# Patient Record
Sex: Male | Born: 1985 | Race: White | Hispanic: No | Marital: Married | Smoking: Never smoker
Health system: Southern US, Community
[De-identification: ages and names within clinical notes are randomized; demographics above are authoritative.]

## PROBLEM LIST (undated history)

## (undated) HISTORY — PX: CLOSED REDUCTION ELBOW FRACTURE: SHX930

## (undated) HISTORY — PX: HERNIA REPAIR: SHX51

## (undated) HISTORY — PX: OTHER SURGICAL HISTORY: SHX169

---
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
## 2014-12-04 ENCOUNTER — Ambulatory Visit (INDEPENDENT_AMBULATORY_CARE_PROVIDER_SITE_OTHER): Payer: BLUE CROSS/BLUE SHIELD | Admitting: Family Medicine

## 2014-12-04 VITALS — BP 118/72 | HR 65 | Temp 98.2°F | Resp 18 | Ht 71.0 in | Wt 139.0 lb

## 2014-12-04 DIAGNOSIS — S90562A Insect bite (nonvenomous), left ankle, initial encounter: Secondary | ICD-10-CM

## 2014-12-04 DIAGNOSIS — W57XXXA Bitten or stung by nonvenomous insect and other nonvenomous arthropods, initial encounter: Secondary | ICD-10-CM | POA: Diagnosis not present

## 2014-12-04 MED ORDER — HYDROXYZINE HCL 25 MG PO TABS: 25.0000 mg | ORAL_TABLET | ORAL | Status: DC | PRN

## 2014-12-04 MED ORDER — PERMETHRIN 5 % EX CREA: 1.0000 "application " | TOPICAL_CREAM | Freq: Once | CUTANEOUS | Status: DC

## 2014-12-04 NOTE — Patient Instructions (Signed)
Apply ice to itchy areas.  Try keeping the calamine topical benadryl,in the fridge o Insect Bite Mosquitoes, flies, fleas, bedbugs, and many other insects can bite. Insect bites are different from insect stings. A sting is when venom is injected into the skin. Some insect bites can transmit infectious diseases. SYMPTOMS  Insect bites usually turn red, swell, and itch for 2 to 4 days. They often go away on their own. TREATMENT  Your caregiver may prescribe antibiotic medicines if a bacterial infection develops in the bite. HOME CARE INSTRUCTIONS  Do not scratch the bite area.  Keep the bite area clean and dry. Wash the bite area thoroughly with soap and water.  Put ice or cool compresses on the bite area.  Put ice in a plastic bag.  Place a towel between your skin and the bag.  Leave the ice on for 20 minutes, 4 times a day for the first 2 to 3 days, or as directed.  You may apply a baking soda paste, cortisone cream, or calamine lotion to the bite area as directed by your caregiver. This can help reduce itching and swelling.  Only take over-the-counter or prescription medicines as directed by your caregiver.  If you are given antibiotics, take them as directed. Finish them even if you start to feel better. You may need a tetanus shot if:  You cannot remember when you had your last tetanus shot.  You have never had a tetanus shot.  The injury broke your skin. If you get a tetanus shot, your arm may swell, get red, and feel warm to the touch. This is common and not a problem. If you need a tetanus shot and you choose not to have one, there is a rare chance of getting tetanus. Sickness from tetanus can be serious. SEEK IMMEDIATE MEDICAL CARE IF:   You have increased pain, redness, or swelling in the bite area.  You see a red line on the skin coming from the bite.  You have a fever.  You have joint pain.  You have a headache or neck pain.  You have unusual weakness.  You  have a rash.  You have chest pain or shortness of breath.  You have abdominal pain, nausea, or vomiting.  You feel unusually tired or sleepy. MAKE SURE YOU:   Understand these instructions.  Will watch your condition.  Will get help right away if you are not doing well or get worse. Document Released: 04/22/2004 Document Revised: 06/07/2011 Document Reviewed: 10/14/2010 [facility] Patient Information 2015 [place], [place]. This information is not intended to replace advice given to you by your health care provider. Make sure you discuss any questions you have with your health care provider.  Scabies Scabies are small bugs (mites) that burrow under the skin and cause red bumps and severe itching. These bugs can only be seen with a microscope. Scabies are highly contagious. They can spread easily from person to person by direct contact. They are also spread through sharing clothing or linens that have the scabies mites living in them. It is not unusual for an entire family to become infected through shared towels, clothing, or bedding.  HOME CARE INSTRUCTIONS   Your caregiver may prescribe a cream or lotion to kill the mites. If cream is prescribed, massage the cream into the entire body from the neck to the bottom of both feet. Also massage the cream into the scalp and face if your child is less than 32 year old. Avoid the  eyes and mouth. Do not wash your hands after application.  Leave the cream on for 8 to 12 hours. Your child should bathe or shower after the 8 to 12 hour application period. Sometimes it is helpful to apply the cream to your child right before bedtime.  One treatment is usually effective and will eliminate approximately 95% of infestations. For severe cases, your caregiver may decide to repeat the treatment in 1 week. Everyone in your household should be treated with one application of the cream.  New rashes or burrows should not appear within 24 to 48 hours after successful  treatment. However, the itching and rash may last for 2 to 4 weeks after successful treatment. Your caregiver may prescribe a medicine to help with the itching or to help the rash go away more quickly.  Scabies can live on clothing or linens for up to 3 days. All of your child's recently used clothing, towels, stuffed toys, and bed linens should be washed in hot water and then dried in a dryer for at least 20 minutes on high heat. Items that cannot be washed should be enclosed in a plastic bag for at least 3 days.  To help relieve itching, bathe your child in a cool bath or apply cool washcloths to the affected areas.  Your child may return to school after treatment with the prescribed cream. SEEK MEDICAL CARE IF:   The itching persists longer than 4 weeks after treatment.  The rash spreads or becomes infected. Signs of infection include red blisters or yellow-tan crust. Document Released: 03/15/2005 Document Revised: 06/07/2011 Document Reviewed: 07/24/2008 [facility] Patient Information 2015 [place], [place]. This information is not intended to replace advice given to you by your health care provider. Make sure you discuss any questions you have with your health care provider.

## 2014-12-04 NOTE — Progress Notes (Signed)
Subjective:    Patient ID: Craig Malone, male    DOB: 1985/09/28, 28 y.o.   MRN: [id number] This chart was scribed for Norberto Sorenson, MD by Littie Deeds, Medical Scribe. This patient was seen in Room 1 and the patient's care was started at 10:36 AM.   Chief Complaint  Patient presents with  . Insect Bite    legs and feet since sunday     HPI HPI Comments: Craig Malone is a 29 y.o. male who presents to the [facility] complaining of gradual onset, pruritic rash mostly in his lower legs, feet, and around his waistline that started 3 days ago; he believes he has multiple insect bites. He also has a bite to the back of his neck. He has tried calamine with relief to the itching. He has not tried Benadryl. Patient was working in the yard 3 weeks ago and also notes that a dog was hanging around his feet that day, so he thinks he could have possible flea exposure. He is unsure if he has had more bites since then. Patient denies bites to his groin area. His wife has not had any insect bites.  Depression screen PHQ 2/9 12/04/2014  Decreased Interest 0  Down, Depressed, Hopeless 0  PHQ - 2 Score 0    History reviewed. No pertinent past medical history. No current outpatient prescriptions on file prior to visit.   No current facility-administered medications on file prior to visit.   No Known Allergies    Review of Systems  Constitutional: Negative for fever, chills, diaphoresis, activity change, appetite change, fatigue and unexpected weight change.  Cardiovascular: Negative for leg swelling.  Musculoskeletal: Negative for myalgias, joint swelling, arthralgias and gait problem.  Skin: Positive for rash.  Allergic/Immunologic: Negative for environmental allergies, food allergies and immunocompromised state.  Neurological: Negative for weakness and numbness.  Hematological: Negative for adenopathy. Does not bruise/bleed easily.  Psychiatric/Behavioral: Positive for sleep  disturbance. Negative for dysphoric mood.       Objective:  BP 118/72 mmHg  Pulse 65  Temp(Src) 98.2 F (36.8 C) (Oral)  Resp 18  Ht 5\' 11"  (1.803 m)  Wt 139 lb (63.05 kg)  BMI 19.40 kg/m2  SpO2 99%  Physical Exam  Constitutional: He is oriented to person, place, and time. He appears well-developed and well-nourished. No distress.  HENT:  Head: Normocephalic and atraumatic.  Mouth/Throat: Oropharynx is clear and moist. No oropharyngeal exudate.  Eyes: Pupils are equal, round, and reactive to light.  Neck: Neck supple.  Cardiovascular: Normal rate.   Pulmonary/Chest: Effort normal.  Musculoskeletal: He exhibits no edema.  Neurological: He is alert and oriented to person, place, and time. No cranial nerve deficit.  Skin: Skin is warm and dry.  Numerous erythematous papules with central pinpoint blisters with serous honey-colored drainage spread over dorsum of foot and coming up anterior aspect of calf with excoriations. Larger erythematous nodules approximately 3-4 mm diameter around left waistline and one on posterior neck.  Psychiatric: He has a normal mood and affect. His behavior is normal.  Nursing note and vitals reviewed.         Assessment & Plan:   1. Insect bite of ankle with local reaction, left, initial encounter   Unknown etiology - suspect chiggers or fleas but certainly could be scabies so cover with permethrin tonight. Ice, prn anti-itch topicals like calamine/benadryl/oatmeal bath. Meds ordered this encounter  Medications  . DISCONTD: amoxicillin (AMOXIL) 500 MG capsule  Sig: Take 500 mg by mouth 3 (three) times daily.  . hydrOXYzine (ATARAX/VISTARIL) 25 MG tablet    Sig: Take 1-2 tablets (25-50 mg total) by mouth every 4 (four) hours as needed for itching.    Dispense:  60 tablet    Refill:  0  . permethrin (ACTICIN) 5 % cream    Sig: Apply 1 application topically once. From neck down all over this evening. Leave on 8-12 hrs and wash off in a.m.     Dispense:  60 g    Refill:  0    I personally performed the services described in this documentation, which was scribed in my presence. The recorded information has been reviewed and considered, and addended by me as needed.  Norberto Sorenson, MD MPH

## 2017-06-13 NOTE — Progress Notes (Signed)
Subjective:    Patient ID: Craig Malone, male    DOB: 10/23/1985, 32 y.o.   MRN: [id number]  HPI:  Mr. Craig ForsterMadison is here to establish as a new pt.  He is a pleasant 32 year old male. PMH: He denies chronic medical conditions/daiy medications. He drinks 4-5 16 oz bottles water/day, estimates to drink 2 cups coffee/day and denies tobacco/ETOH use. He exercises 3 days week- Crossfit with his wife who is a Systems analystpersonal trainer. He reports quality sleep. He reports overall health as "excellent".  Patient Care Team    Relationship Specialty Notifications Start End  Julaine Fusianford, Tienna Bienkowski D, NP PCP - General Family Medicine  06/14/17     Patient Active Problem List   Diagnosis Date Noted  . Healthcare maintenance 06/14/2017     No past medical history on file.   Past Surgical History:  Procedure Laterality Date  . arm surgery    . CLOSED REDUCTION ELBOW FRACTURE Left   . HERNIA REPAIR       Family History  Problem Relation Age of Onset  . Diabetes Paternal Grandmother   . Diabetes Paternal Grandfather      Social History   Substance and Sexual Activity  Drug Use No     Social History   Substance and Sexual Activity  Alcohol Use No  . Alcohol/week: 0.0 oz     Social History   Tobacco Use  Smoking Status Never Smoker  Smokeless Tobacco Never Used     Outpatient Encounter Medications as of 06/14/2017  Medication Sig  . [DISCONTINUED] hydrOXYzine (ATARAX/VISTARIL) 25 MG tablet Take 1-2 tablets (25-50 mg total) by mouth every 4 (four) hours as needed for itching.  . [DISCONTINUED] permethrin (ACTICIN) 5 % cream Apply 1 application topically once. From neck down all over this evening. Leave on 8-12 hrs and wash off in a.m.   No facility-administered encounter medications on file as of 06/14/2017.     Allergies: Patient has no known allergies.  Body mass index is 21.94 kg/m.  Blood pressure (!) 107/59, pulse 68, height 5\' 11"  (1.803 m), weight 157 lb 4.8 oz (71.4  kg), SpO2 97 %.       Review of Systems  Constitutional: Positive for fatigue. Negative for activity change, appetite change, chills, diaphoresis, fever and unexpected weight change.  HENT: Negative for congestion.   Eyes: Negative for visual disturbance.  Respiratory: Negative for cough, chest tightness, shortness of breath, wheezing and stridor.   Cardiovascular: Negative for chest pain, palpitations and leg swelling.  Gastrointestinal: Negative for abdominal distention, abdominal pain, blood in stool, constipation, diarrhea, nausea and vomiting.  Endocrine: Negative for cold intolerance, heat intolerance, polydipsia, polyphagia and polyuria.  Genitourinary: Negative for difficulty urinating and flank pain.  Musculoskeletal: Negative for arthralgias, back pain, gait problem, joint swelling, myalgias, neck pain and neck stiffness.  Neurological: Negative for dizziness and headaches.  Hematological: Does not bruise/bleed easily.  Psychiatric/Behavioral: Negative for hallucinations, self-injury, sleep disturbance and suicidal ideas. The patient is not nervous/anxious and is not hyperactive.        Objective:   Physical Exam  Constitutional: He is oriented to person, place, and time. He appears well-developed and well-nourished. No distress.  HENT:  Head: Normocephalic and atraumatic.  Right Ear: External ear normal.  Eyes: Conjunctivae are normal. Pupils are equal, round, and reactive to light.  Cardiovascular: Normal rate, regular rhythm, normal heart sounds and intact distal pulses.  No murmur heard. Pulmonary/Chest: Effort normal and breath sounds normal.  No respiratory distress. He has no wheezes. He has no rales. He exhibits no tenderness.  Neurological: He is alert and oriented to person, place, and time.  Skin: Skin is warm and dry. No rash noted. He is not diaphoretic. No erythema. No pallor.  Psychiatric: He has a normal mood and affect. His behavior is normal. Judgment and  thought content normal.  Nursing note and vitals reviewed.         Assessment & Plan:   1. Healthcare maintenance     Healthcare maintenance You are doing a GREAT job taking care of yourself! Recommend fasting lab appt in the couple months then schedule a complete physical a week after.    FOLLOW-UP:  Return in about 2 months (around 08/14/2017) for CPE, Fasting Labs.

## 2017-06-14 ENCOUNTER — Ambulatory Visit (INDEPENDENT_AMBULATORY_CARE_PROVIDER_SITE_OTHER): Payer: BLUE CROSS/BLUE SHIELD | Admitting: Adult Health

## 2017-06-14 ENCOUNTER — Encounter: Payer: Self-pay | Admitting: Adult Health

## 2017-06-14 ENCOUNTER — Other Ambulatory Visit: Payer: Self-pay | Admitting: Adult Health

## 2017-06-14 VITALS — BP 107/59 | HR 68 | Ht 71.0 in | Wt 157.3 lb

## 2017-06-14 DIAGNOSIS — Z0000 Encounter for general adult medical examination without abnormal findings: Secondary | ICD-10-CM

## 2017-06-14 NOTE — Patient Instructions (Signed)

## 2017-06-14 NOTE — Assessment & Plan Note (Signed)
You are doing a GREAT job taking care of yourself! Recommend fasting lab appt in the couple months then schedule a complete physical a week after.

## 2017-07-04 ENCOUNTER — Other Ambulatory Visit (INDEPENDENT_AMBULATORY_CARE_PROVIDER_SITE_OTHER): Payer: BLUE CROSS/BLUE SHIELD

## 2017-07-04 DIAGNOSIS — Z0000 Encounter for general adult medical examination without abnormal findings: Secondary | ICD-10-CM

## 2017-07-05 LAB — LIPID PANEL
CHOL/HDL RATIO: 3.6 ratio (ref 0.0–5.0)
Cholesterol, Total: 118 mg/dL (ref 100–199)
HDL: 33 mg/dL — ABNORMAL LOW (ref >39)
LDL Calculated: 75 mg/dL (ref 0–99)
Triglycerides: 52 mg/dL (ref 0–149)
VLDL Cholesterol Cal: 10 mg/dL (ref 5–40)

## 2017-07-05 LAB — HEMOGLOBIN A1C
ESTIMATED AVERAGE GLUCOSE: 111 mg/dL
HEMOGLOBIN A1C: 5.5 % (ref 4.8–5.6)

## 2017-07-05 LAB — COMPREHENSIVE METABOLIC PANEL
A/G RATIO: 1.8 (ref 1.2–2.2)
ALBUMIN: 4.2 g/dL (ref 3.5–5.5)
ALK PHOS: 45 IU/L (ref 39–117)
ALT: 20 IU/L (ref 0–44)
AST: 25 IU/L (ref 0–40)
BILIRUBIN TOTAL: 0.4 mg/dL (ref 0.0–1.2)
BUN / CREAT RATIO: 10 (ref 9–20)
BUN: 10 mg/dL (ref 6–20)
CHLORIDE: 104 mmol/L (ref 96–106)
CO2: 21 mmol/L (ref 20–29)
Calcium: 8.8 mg/dL (ref 8.7–10.2)
Creatinine, Ser: 0.96 mg/dL (ref 0.76–1.27)
GFR calc Af Amer: 121 mL/min/{1.73_m2} (ref >59)
GFR calc non Af Amer: 105 mL/min/{1.73_m2} (ref >59)
GLUCOSE: 91 mg/dL (ref 65–99)
Globulin, Total: 2.3 g/dL (ref 1.5–4.5)
POTASSIUM: 4.2 mmol/L (ref 3.5–5.2)
SODIUM: 139 mmol/L (ref 134–144)
Total Protein: 6.5 g/dL (ref 6.0–8.5)

## 2017-07-05 LAB — CBC WITH DIFFERENTIAL/PLATELET
BASOS ABS: 0 10*3/uL (ref 0.0–0.2)
Basos: 1 %
EOS (ABSOLUTE): 0.1 10*3/uL (ref 0.0–0.4)
Eos: 3 %
Hematocrit: 44.2 % (ref 37.5–51.0)
Hemoglobin: 14.7 g/dL (ref 13.0–17.7)
Immature Grans (Abs): 0 10*3/uL (ref 0.0–0.1)
Immature Granulocytes: 0 %
LYMPHS ABS: 1.7 10*3/uL (ref 0.7–3.1)
Lymphs: 48 %
MCH: 30.5 pg (ref 26.6–33.0)
MCHC: 33.3 g/dL (ref 31.5–35.7)
MCV: 92 fL (ref 79–97)
MONOCYTES: 11 %
MONOS ABS: 0.4 10*3/uL (ref 0.1–0.9)
Neutrophils Absolute: 1.3 10*3/uL — ABNORMAL LOW (ref 1.4–7.0)
Neutrophils: 37 %
Platelets: 180 10*3/uL (ref 150–379)
RBC: 4.82 x10E6/uL (ref 4.14–5.80)
RDW: 12.7 % (ref 12.3–15.4)
WBC: 3.5 10*3/uL (ref 3.4–10.8)

## 2017-07-05 LAB — TSH: TSH: 1.47 u[IU]/mL (ref 0.450–4.500)

## 2017-07-06 NOTE — Progress Notes (Signed)
Subjective:    Patient ID: Craig Malone, male    DOB: 08/09/1985, 32 y.o.   MRN: [id number]  HPI:06/14/17 OV:  Craig Malone is here to establish as a new pt.  He is a pleasant 32 year old male. PMH: He denies chronic medical conditions/daiy medications. He drinks 4-5 16 oz bottles water/day, estimates to drink 2 cups coffee/day and denies tobacco/ETOH use. He exercises 3 days week- Crossfit with his wife who is a Systems analystpersonal trainer. He reports quality sleep. He reports overall health as "excellent".  07/07/17 OV:  Craig Malone is here for CPE. He denies acute change in health since initial OV 4 weeks ago. He continues to exercise on regular basis. Recent labs reviewed with pt- only abnormality, slightly low HDL- 33 He thinks he may have a family hx of hyperlipidemia  Patient Care Team    Relationship Specialty Notifications Start End  Burnadette Baskett, Jinny BlossomKaty D, NP PCP - General Family Medicine  06/14/17     Patient Active Problem List   Diagnosis Date Noted  . Low HDL (under 40) 07/07/2017  . Healthcare maintenance 06/14/2017     History reviewed. No pertinent past medical history.   Past Surgical History:  Procedure Laterality Date  . arm surgery    . CLOSED REDUCTION ELBOW FRACTURE Left   . HERNIA REPAIR       Family History  Problem Relation Age of Onset  . Diabetes Paternal Grandmother   . Diabetes Paternal Grandfather      Social History   Substance and Sexual Activity  Drug Use No     Social History   Substance and Sexual Activity  Alcohol Use No  . Alcohol/week: 0.0 oz     Social History   Tobacco Use  Smoking Status Never Smoker  Smokeless Tobacco Never Used     No outpatient encounter medications on file as of 07/07/2017.   No facility-administered encounter medications on file as of 07/07/2017.     Allergies: Patient has no known allergies.  Body mass index is 21.35 kg/m.  Blood pressure 123/81, pulse 75, height 5\' 11"  (1.803 m), weight  153 lb 1.6 oz (69.4 kg), SpO2 100 %.  Review of Systems  Constitutional: Positive for fatigue. Negative for activity change, appetite change, chills, diaphoresis, fever and unexpected weight change.  HENT: Negative for congestion.   Eyes: Negative for visual disturbance.  Respiratory: Negative for cough, chest tightness, shortness of breath, wheezing and stridor.   Cardiovascular: Negative for chest pain, palpitations and leg swelling.  Gastrointestinal: Negative for abdominal distention, abdominal pain, blood in stool, constipation, diarrhea, nausea and vomiting.  Endocrine: Negative for cold intolerance, heat intolerance, polydipsia, polyphagia and polyuria.  Genitourinary: Negative for difficulty urinating and flank pain.  Musculoskeletal: Negative for arthralgias, back pain, gait problem, joint swelling, myalgias, neck pain and neck stiffness.  Neurological: Negative for dizziness and headaches.  Hematological: Does not bruise/bleed easily.  Psychiatric/Behavioral: Negative for hallucinations, self-injury, sleep disturbance and suicidal ideas. The patient is not nervous/anxious and is not hyperactive.        Objective:   Physical Exam  Constitutional: He is oriented to person, place, and time. He appears well-developed and well-nourished. No distress.  HENT:  Head: Normocephalic and atraumatic.  Right Ear: External ear normal.  Eyes: Pupils are equal, round, and reactive to light. Conjunctivae are normal.  Cardiovascular: Normal rate, regular rhythm, normal heart sounds and intact distal pulses.  No murmur heard. Pulmonary/Chest: Effort normal and breath sounds normal.  No respiratory distress. He has no wheezes. He has no rales. He exhibits no tenderness.  Neurological: He is alert and oriented to person, place, and time.  Skin: Skin is warm and dry. No rash noted. He is not diaphoretic. No erythema. No pallor.  Psychiatric: He has a normal mood and affect. His behavior is normal.  Judgment and thought content normal.  Nursing note and vitals reviewed.      Assessment & Plan:   1. Healthcare maintenance   2. Low HDL (under 40)     Healthcare maintenance Overall you are doing A GREAT JOB with your health. Labs look great! Increase water intake, Recommend annual physical with fasting labs.  Low HDL (under 40) Ref Range & Units 3d ago 07/04/17  Cholesterol, Total 100 - 199 mg/dL 161   Triglycerides 0 - 149 mg/dL 52   HDL >09 mg/dL [id number]    VLDL Cholesterol Cal 5 - 40 mg/dL 10   LDL Calculated 0 - 99 mg/dL 75   Chol/HDL Ratio 0.0 - 5.0 ratio 3.6    Family hx of hyperlipidemia  Advised to increase cardio training   FOLLOW-UP:  Return in about 1 year (around 07/08/2018) for CPE, Fasting Labs.

## 2017-07-07 ENCOUNTER — Encounter: Payer: Self-pay | Admitting: Adult Health

## 2017-07-07 ENCOUNTER — Ambulatory Visit (INDEPENDENT_AMBULATORY_CARE_PROVIDER_SITE_OTHER): Payer: BLUE CROSS/BLUE SHIELD | Admitting: Adult Health

## 2017-07-07 VITALS — BP 123/81 | HR 75 | Ht 71.0 in | Wt 153.1 lb

## 2017-07-07 DIAGNOSIS — Z0000 Encounter for general adult medical examination without abnormal findings: Secondary | ICD-10-CM | POA: Diagnosis not present

## 2017-07-07 DIAGNOSIS — E786 Lipoprotein deficiency: Secondary | ICD-10-CM | POA: Insufficient documentation

## 2017-07-07 NOTE — Assessment & Plan Note (Signed)
Ref Range & Units 3d ago 07/04/17  Cholesterol, Total 100 - 199 mg/dL 161118   Triglycerides 0 - 149 mg/dL 52   HDL >09>39 mg/dL [id number]    VLDL Cholesterol Cal 5 - 40 mg/dL 10   LDL Calculated 0 - 99 mg/dL 75   Chol/HDL Ratio 0.0 - 5.0 ratio 3.6    Family hx of hyperlipidemia  Advised to increase cardio training

## 2017-07-07 NOTE — Assessment & Plan Note (Signed)
Overall you are doing A GREAT JOB with your health. Labs look great! Increase water intake, Recommend annual physical with fasting labs.

## 2017-07-07 NOTE — Patient Instructions (Signed)
Mediterranean Diet A Mediterranean diet refers to food and lifestyle choices that are based on the traditions of countries located on the [place]. This way of eating has been shown to help prevent certain conditions and improve outcomes for people who have chronic diseases, like kidney disease and heart disease. What are tips for following this plan? Lifestyle  Cook and eat meals together with your family, when possible.  Drink enough fluid to keep your urine clear or pale yellow.  Be physically active every day. This includes: ? Aerobic exercise like running or swimming. ? Leisure activities like gardening, walking, or housework.  Get 7-8 hours of sleep each night.  If recommended by your health care provider, drink red wine in moderation. This means 1 glass a day for nonpregnant women and 2 glasses a day for men. A glass of wine equals 5 oz (150 mL). Reading food labels  Check the serving size of packaged foods. For foods such as rice and pasta, the serving size refers to the amount of cooked product, not dry.  Check the total fat in packaged foods. Avoid foods that have saturated fat or trans fats.  Check the ingredients list for added sugars, such as corn syrup. Shopping  At the grocery store, buy most of your food from the areas near the walls of the store. This includes: ? Fresh fruits and vegetables (produce). ? Grains, beans, nuts, and seeds. Some of these may be available in unpackaged forms or large amounts (in bulk). ? Fresh seafood. ? Poultry and eggs. ? Low-fat dairy products.  Buy whole ingredients instead of prepackaged foods.  Buy fresh fruits and vegetables in-season from local farmers markets.  Buy frozen fruits and vegetables in resealable bags.  If you do not have access to quality fresh seafood, buy precooked frozen shrimp or canned fish, such as tuna, salmon, or sardines.  Buy small amounts of raw or cooked vegetables, salads, or olives from the  deli or salad bar at your store.  Stock your pantry so you always have certain foods on hand, such as olive oil, canned tuna, canned tomatoes, rice, pasta, and beans. Cooking  Cook foods with extra-virgin olive oil instead of using butter or other vegetable oils.  Have meat as a side dish, and have vegetables or grains as your main dish. This means having meat in small portions or adding small amounts of meat to foods like pasta or stew.  Use beans or vegetables instead of meat in common dishes like chili or lasagna.  Experiment with different cooking methods. Try roasting or broiling vegetables instead of steaming or sauteing them.  Add frozen vegetables to soups, stews, pasta, or rice.  Add nuts or seeds for added healthy fat at each meal. You can add these to yogurt, salads, or vegetable dishes.  Marinate fish or vegetables using olive oil, lemon juice, garlic, and fresh herbs. Meal planning  Plan to eat 1 vegetarian meal one day each week. Try to work up to 2 vegetarian meals, if possible.  Eat seafood 2 or more times a week.  Have healthy snacks readily available, such as: ? Vegetable sticks with hummus. ? Greek yogurt. ? Fruit and nut trail mix.  Eat balanced meals throughout the week. This includes: ? Fruit: 2-3 servings a day ? Vegetables: 4-5 servings a day ? Low-fat dairy: 2 servings a day ? Fish, poultry, or lean meat: 1 serving a day ? Beans and legumes: 2 or more servings a week ? Nuts   and seeds: 1-2 servings a day ? Whole grains: 6-8 servings a day ? Extra-virgin olive oil: 3-4 servings a day  Limit red meat and sweets to only a few servings a month What are my food choices?  Mediterranean diet ? Recommended ? Grains: Whole-grain pasta. Brown rice. Bulgar wheat. Polenta. Couscous. Whole-wheat bread. Orpah Cobbatmeal. Quinoa. ? Vegetables: Artichokes. Beets. Broccoli. Cabbage. Carrots. Eggplant. Green beans. Chard. Kale. Spinach. Onions. Leeks. Peas. Squash.  Tomatoes. Peppers. Radishes. ? Fruits: Apples. Apricots. Avocado. Berries. Bananas. Cherries. Dates. Figs. Grapes. Lemons. Melon. Oranges. Peaches. Plums. Pomegranate. ? Meats and other protein foods: Beans. Almonds. Sunflower seeds. Pine nuts. Peanuts. Cod. Salmon. Scallops. Shrimp. Tuna. Tilapia. Clams. Oysters. Eggs. ? Dairy: Low-fat milk. Cheese. Greek yogurt. ? Beverages: Water. Red wine. Herbal tea. ? Fats and oils: Extra virgin olive oil. Avocado oil. Grape seed oil. ? Sweets and desserts: [place] yogurt with honey. Baked apples. Poached pears. Trail mix. ? Seasoning and other foods: Basil. Cilantro. Coriander. Cumin. Mint. Parsley. Sage. Rosemary. Tarragon. Garlic. Oregano. Thyme. Pepper. Balsalmic vinegar. Tahini. Hummus. Tomato sauce. Olives. Mushrooms. ? Limit these ? Grains: Prepackaged pasta or rice dishes. Prepackaged cereal with added sugar. ? Vegetables: Deep fried potatoes (french fries). ? Fruits: Fruit canned in syrup. ? Meats and other protein foods: Beef. Pork. Lamb. Poultry with skin. Hot dogs. Tomasa BlaseBacon. ? Dairy: Ice cream. Sour cream. Whole milk. ? Beverages: Juice. Sugar-sweetened soft drinks. Beer. Liquor and spirits. ? Fats and oils: Butter. Canola oil. Vegetable oil. Beef fat (tallow). Lard. ? Sweets and desserts: Cookies. Cakes. Pies. Candy. ? Seasoning and other foods: Mayonnaise. Premade sauces and marinades. ? The items listed may not be a complete list. Talk with your dietitian about what dietary choices are right for you. Summary  The Mediterranean diet includes both food and lifestyle choices.  Eat a variety of fresh fruits and vegetables, beans, nuts, seeds, and whole grains.  Limit the amount of red meat and sweets that you eat.  Talk with your health care provider about whether it is safe for you to drink red wine in moderation. This means 1 glass a day for nonpregnant women and 2 glasses a day for men. A glass of wine equals 5 oz (150 mL). This information  is not intended to replace advice given to you by your health care provider. Make sure you discuss any questions you have with your health care provider. Document Released: 11/06/2015 Document Revised: 12/09/2015 Document Reviewed: 11/06/2015 Elsevier Interactive Patient Education  2018 [employer]  Overall you are doing A GREAT JOB with your health. Labs look great! Increase water intake, Recommend annual physical with fasting labs. NICE TO SEE YOU!

## 2018-09-13 ENCOUNTER — Telehealth: Payer: Self-pay | Admitting: Adult Health

## 2018-09-13 NOTE — Telephone Encounter (Signed)
Patient called to schedule a CPE but no lab order are listed in chart.  --- Forwarding message to medical assistant to add when time available.  --glh

## 2018-09-14 ENCOUNTER — Other Ambulatory Visit: Payer: Self-pay

## 2018-09-14 DIAGNOSIS — Z0000 Encounter for general adult medical examination without abnormal findings: Secondary | ICD-10-CM

## 2018-09-14 NOTE — Telephone Encounter (Signed)
Done

## 2018-09-14 NOTE — Telephone Encounter (Signed)
Good Morning [name], Can you please put in standard fasting labs Thanks! Craig Malone

## 2018-09-14 NOTE — Telephone Encounter (Signed)
Please advise 

## 2018-09-18 ENCOUNTER — Encounter: Payer: Self-pay | Admitting: Adult Health

## 2018-09-18 ENCOUNTER — Ambulatory Visit (INDEPENDENT_AMBULATORY_CARE_PROVIDER_SITE_OTHER): Payer: BLUE CROSS/BLUE SHIELD | Admitting: Adult Health

## 2018-09-18 ENCOUNTER — Other Ambulatory Visit: Payer: Self-pay

## 2018-09-18 VITALS — BP 107/68 | HR 63 | Temp 98.8°F | Ht 71.0 in | Wt 162.0 lb

## 2018-09-18 DIAGNOSIS — R51 Headache: Secondary | ICD-10-CM | POA: Diagnosis not present

## 2018-09-18 DIAGNOSIS — Z0000 Encounter for general adult medical examination without abnormal findings: Secondary | ICD-10-CM

## 2018-09-18 DIAGNOSIS — Z833 Family history of diabetes mellitus: Secondary | ICD-10-CM | POA: Diagnosis not present

## 2018-09-18 DIAGNOSIS — R519 Headache, unspecified: Secondary | ICD-10-CM | POA: Insufficient documentation

## 2018-09-18 DIAGNOSIS — G4489 Other headache syndrome: Secondary | ICD-10-CM | POA: Diagnosis not present

## 2018-09-18 LAB — POCT GLYCOSYLATED HEMOGLOBIN (HGB A1C): Hemoglobin A1C: 5.3 % (ref 4.0–5.6)

## 2018-09-18 NOTE — Assessment & Plan Note (Signed)
Please schedule complete physical this fall, fasting labs the week prior.  Continue to social distance and wear a mask when in public.

## 2018-09-18 NOTE — Progress Notes (Signed)
Subjective:    Patient ID: Craig Malone, male    DOB: Jul 31, 1985, 33 y.o.   MRN: [id number]  HPI:  Mr. Rubi presents with L frontal/parietal HA pain that began >6 months ago. He reports HA will occur 3-4 days out of week. Pain will be localized to L frontal/parietal area of head, last 10 seconds to 3 minutes.Marland Kitchen He describes pain as "pressure and throbbing", rated 2/10. When HA occurs he denies change in vision, N/V, or imbalance issues. He denies hx of head trauma. He denies hx of recurrent HA pain. He denies family hx of neurological disease. He has increased water intake, now drinks > 100 oz /day. He has tried increasing/decreasing his caffeine intake- did not decrease HA sx's. He continues to abstain from tobacco/vape/ETOH use He participates in [employer] exercise 3 times/week. He has been eating small snacks every several hours to see if hypoglycemia was causative factor-did not decrease HA sx  Lab Results  Component Value Date   HGBA1C 5.3 09/18/2018   HGBA1C 5.5 07/04/2017   He needs fasting labs  Patient Care Team    Relationship Specialty Notifications Start End  Esaw Grandchild, NP PCP - General Family Medicine  06/14/17     Patient Active Problem List   Diagnosis Date Noted  . Headache 09/18/2018  . Low HDL (under 40) 07/07/2017  . Healthcare maintenance 06/14/2017     History reviewed. No pertinent past medical history.   Past Surgical History:  Procedure Laterality Date  . arm surgery    . CLOSED REDUCTION ELBOW FRACTURE Left   . HERNIA REPAIR       Family History  Problem Relation Age of Onset  . Diabetes Paternal Grandmother   . Diabetes Paternal Grandfather      Social History   Substance and Sexual Activity  Drug Use No     Social History   Substance and Sexual Activity  Alcohol Use No  . Alcohol/week: 0.0 standard drinks     Social History   Tobacco Use  Smoking Status Never Smoker  Smokeless Tobacco Never Used      No outpatient encounter medications on file as of 09/18/2018.   No facility-administered encounter medications on file as of 09/18/2018.     Allergies: Patient has no known allergies.  Body mass index is 22.59 kg/m.  Blood pressure 107/68, pulse 63, temperature 98.8 F (37.1 C), temperature source Oral, height 5\' 11"  (1.803 m), weight 162 lb (73.5 kg), SpO2 99 %.  Review of Systems  Constitutional: Negative for activity change, appetite change, chills, diaphoresis, fatigue, fever and unexpected weight change.  HENT: Negative for congestion.   Eyes: Negative for visual disturbance.  Respiratory: Negative for cough, chest tightness, shortness of breath, wheezing and stridor.   Cardiovascular: Negative for chest pain, palpitations and leg swelling.  Gastrointestinal: Negative for abdominal distention, anal bleeding, blood in stool, constipation, diarrhea, nausea and vomiting.  Endocrine: Negative for cold intolerance, heat intolerance, polydipsia, polyphagia and polyuria.  Neurological: Positive for headaches. Negative for dizziness, tremors, syncope, speech difficulty, weakness and light-headedness.  Hematological: Negative for adenopathy. Does not bruise/bleed easily.       Objective:   Physical Exam Vitals signs and nursing note reviewed.  Constitutional:      General: He is not in acute distress.    Appearance: He is normal weight. He is not ill-appearing, toxic-appearing or diaphoretic.  HENT:     Head: Normocephalic and atraumatic.     Mouth/Throat:  Mouth: Mucous membranes are moist.     Pharynx: Oropharynx is clear.  Eyes:     General: No visual field deficit.    Extraocular Movements: Extraocular movements intact.     Right eye: Normal extraocular motion and no nystagmus.     Left eye: Normal extraocular motion and no nystagmus.     Pupils: Pupils are equal, round, and reactive to light. Pupils are equal.     Right eye: Pupil is round and reactive.     Left  eye: Pupil is round and reactive.  Neck:     Musculoskeletal: Normal range of motion and neck supple.  Cardiovascular:     Rate and Rhythm: Normal rate.     Heart sounds: Normal heart sounds. No murmur. No friction rub. No gallop.   Pulmonary:     Effort: Pulmonary effort is normal. No respiratory distress.     Breath sounds: Normal breath sounds. No stridor. No wheezing, rhonchi or rales.  Chest:     Chest wall: No tenderness.  Lymphadenopathy:     Cervical: No cervical adenopathy.  Neurological:     Mental Status: He is alert.     Cranial Nerves: No cranial nerve deficit.     Sensory: No sensory deficit.     Motor: No weakness.     Coordination: Coordination normal.     Gait: Gait normal.     Deep Tendon Reflexes: Reflexes normal.  Psychiatric:        Mood and Affect: Mood normal. Mood is not anxious or depressed.        Speech: Speech normal.        Behavior: Behavior normal. Behavior is not agitated.        Cognition and Memory: Cognition is not impaired. Memory is not impaired.       Assessment & Plan:   1. Family history of diabetes mellitus   2. Other headache syndrome   3. Acute nonintractable headache, unspecified headache type   4. Healthcare maintenance     Headache A1c (average of blood sugar) is normal. Remain well hydrated, continue to eat healthy and exercise regularly. MRI without contrast ordered to address the intermittent headache you hvave been experiencing the last 6 months. The MRI may require a prior authorization from your insurance, which will take some time to complete.    Healthcare maintenance Please schedule complete physical this fall, fasting labs the week prior.  Continue to social distance and wear a mask when in public.    FOLLOW-UP:  Return in about 3 months (around 12/19/2018) for CPE, Fasting Labs.

## 2018-09-18 NOTE — Assessment & Plan Note (Addendum)
A1c (average of blood sugar) is normal. Remain well hydrated, continue to eat healthy and exercise regularly. MRI without contrast ordered to address the intermittent headache you hvave been experiencing the last 6 months. The MRI may require a prior authorization from your insurance, which will take some time to complete.

## 2018-09-18 NOTE — Patient Instructions (Addendum)
General Headache Without Cause A headache is pain or discomfort that is felt around the head or neck area. There are many causes and types of headaches. In some cases, the cause may not be found. Follow these instructions at home: Watch your condition for any changes. Let your doctor know about them. Take these steps to help with your condition: Managing pain      Take over-the-counter and prescription medicines only as told by your doctor.  Lie down in a dark, quiet room when you have a headache.  If told, put ice on your head and neck area: ? Put ice in a plastic bag. ? Place a towel between your skin and the bag. ? Leave the ice on for 20 minutes, 2-3 times per day.  If told, put heat on the affected area. Use the heat source that your doctor recommends, such as a moist heat pack or a heating pad. ? Place a towel between your skin and the heat source. ? Leave the heat on for 20-30 minutes. ? Remove the heat if your skin turns bright red. This is very important if you are unable to feel pain, heat, or cold. You may have a greater risk of getting burned.  Keep lights dim if bright lights bother you or make your headaches worse. Eating and drinking  Eat meals on a regular schedule.  If you drink alcohol: ? Limit how much you use to:  0-1 drink a day for women.  0-2 drinks a day for men. ? Be aware of how much alcohol is in your drink. In the [place] one drink equals one 12 oz bottle of beer (355 mL), one 5 oz glass of wine (148 mL), or one 1 oz glass of hard liquor (44 mL).  Stop drinking caffeine, or reduce how much caffeine you drink. General instructions   Keep a journal to find out if certain things bring on headaches. For example, write down: ? What you eat and drink. ? How much sleep you get. ? Any change to your diet or medicines.  Get a massage or try other ways to relax.  Limit stress.  Sit up straight. Do not tighten (tense) your muscles.  Do not use any  products that contain nicotine or tobacco. This includes cigarettes, e-cigarettes, and chewing tobacco. If you need help quitting, ask your doctor.  Exercise regularly as told by your doctor.  Get enough sleep. This often means 7-9 hours of sleep each night.  Keep all follow-up visits as told by your doctor. This is important. Contact a doctor if:  Your symptoms are not helped by medicine.  You have a headache that feels different than the other headaches.  You feel sick to your stomach (nauseous) or you throw up (vomit).  You have a fever. Get help right away if:  Your headache gets very bad quickly.  Your headache gets worse after a lot of physical activity.  You keep throwing up.  You have a stiff neck.  You have trouble seeing.  You have trouble speaking.  You have pain in the eye or ear.  Your muscles are weak or you lose muscle control.  You lose your balance or have trouble walking.  You feel like you will pass out (faint) or you pass out.  You are mixed up (confused).  You have a seizure. Summary  A headache is pain or discomfort that is felt around the head or neck area.  There are many causes and   types of headaches. In some cases, the cause may not be found.  Keep a journal to help find out what causes your headaches. Watch your condition for any changes. Let your doctor know about them.  Contact a doctor if you have a headache that is different from usual, or if your headache is not helped by medicine.  Get help right away if your headache gets very bad, you throw up, you have trouble seeing, you lose your balance, or you have a seizure. This information is not intended to replace advice given to you by your health care provider. Make sure you discuss any questions you have with your health care provider. Document Released: 12/23/2007 Document Revised: 10/03/2017 Document Reviewed: 10/03/2017 Elsevier Interactive Patient Education  2019 [place].   A1c (average of blood sugar) is normal. Remain well hydrated, continue to eat healthy and exercise regularly. MRI without contrast ordered to address the intermittent headache you hvave been experiencing the last 6 months. The MRI may require a prior authorization from your insurance, which will take some time to complete. Please schedule complete physical this fall, fasting labs the week prior.  Continue to social distance and wear a mask when in public.

## 2018-10-13 ENCOUNTER — Ambulatory Visit
Admission: RE | Admit: 2018-10-13 | Discharge: 2018-10-13 | Disposition: A | Discharge status: Home / Self Care | Payer: BLUE CROSS/BLUE SHIELD | Source: Ambulatory Visit | Attending: Adult Health | Admitting: Adult Health

## 2018-10-13 ENCOUNTER — Other Ambulatory Visit: Payer: Self-pay

## 2018-10-13 DIAGNOSIS — G4489 Other headache syndrome: Secondary | ICD-10-CM

## 2018-10-17 ENCOUNTER — Emergency Department (HOSPITAL_COMMUNITY): Payer: BLUE CROSS/BLUE SHIELD

## 2018-10-17 ENCOUNTER — Encounter (HOSPITAL_COMMUNITY): Payer: Self-pay

## 2018-10-17 ENCOUNTER — Other Ambulatory Visit: Payer: Self-pay

## 2018-10-17 ENCOUNTER — Emergency Department (HOSPITAL_COMMUNITY)
Admission: EM | Admit: 2018-10-17 | Discharge: 2018-10-18 | Disposition: A | Discharge status: Home / Self Care | Payer: BLUE CROSS/BLUE SHIELD | Attending: Emergency Medicine | Admitting: Emergency Medicine

## 2018-10-17 DIAGNOSIS — Z20822 Contact with and (suspected) exposure to covid-19: Secondary | ICD-10-CM

## 2018-10-17 DIAGNOSIS — R509 Fever, unspecified: Secondary | ICD-10-CM | POA: Diagnosis present

## 2018-10-17 DIAGNOSIS — U071 COVID-19: Secondary | ICD-10-CM | POA: Insufficient documentation

## 2018-10-17 DIAGNOSIS — R0989 Other specified symptoms and signs involving the circulatory and respiratory systems: Secondary | ICD-10-CM

## 2018-10-17 DIAGNOSIS — M791 Myalgia, unspecified site: Secondary | ICD-10-CM

## 2018-10-17 NOTE — Discharge Instructions (Signed)
You have a pending COVID-19 test, I will call you with your results.  Please continue to quarantine at home and monitor your symptoms closely, until results return. You chest x-ray was clear. Antibiotics are not helpful in treating viral infection, the virus should run its course in about 5-7 days. Please make sure you are drinking plenty of fluids. You can treat your symptoms supportively with tylenol for fevers and pains, and over the counter cough syrups and throat lozenges to help with cough. If your symptoms are not improving please follow up with you Primary doctor.   I recommend that you purchase a home pulse ox to help better monitor your oxygen at home, if you start to have increased work of breathing or shortness of breath or your oxygen drops below 90% please immediately return to the hospital for reevaluation.  If you develop persistent fevers, shortness of breath or difficulty breathing, chest pain, severe headache and neck pain, persistent nausea and vomiting or other new or concerning symptoms return to the Emergency department.

## 2018-10-17 NOTE — ED Provider Notes (Signed)
[facility]ENC[facility] Provider Note   CSN: [id number] Arrival date & time: 10/17/18  2024    History   Chief Complaint Chief Complaint  Patient presents with  . Back Pain  . Fever  . Congestion    HPI Craig Malone is a 33 y.o. male.     Craig Malone is a 33 y.o. male with history of headaches, otherwise healthy, who presents to the ED for evaluation of low-grade fever, chest congestion and myalgias over the past 2 to 3 days.  He reports T-max of 99.6 at home.  He reports he feels some intermittent shortness of breath as if his chest is congested with an occasional nonproductive cough, he is also had some nasal congestion, denies sore throat.  He denies any associated chest pain.  No abdominal pain, nausea, vomiting or diarrhea.  Denies any known COVID-19 contacts but does report that he works Air cabin crewdisinfecting commercial aircraft's and is concerned he may have been exposed to COVID-19 at work.  He reports that his wife who is currently pregnant and young daughter are supposed to return home tomorrow and he wants to get tested so he can make necessary arrangements to limit their exposure if he does have coronavirus.  No medications prior to arrival.  No other aggravating or alleviating factors.     History reviewed. No pertinent past medical history.  Patient Active Problem List   Diagnosis Date Noted  . Headache 09/18/2018  . Low HDL (under 40) 07/07/2017  . Healthcare maintenance 06/14/2017    Past Surgical History:  Procedure Laterality Date  . arm surgery    . CLOSED REDUCTION ELBOW FRACTURE Left   . HERNIA REPAIR          Home Medications    Prior to Admission medications   Not on File    Family History Family History  Problem Relation Age of Onset  . Diabetes Paternal Grandmother   . Diabetes Paternal Grandfather     Social History Social History   Tobacco Use  . Smoking status: Never Smoker  . Smokeless tobacco:  Never Used  Substance Use Topics  . Alcohol use: No    Alcohol/week: 0.0 standard drinks  . Drug use: No     Allergies   Patient has no known allergies.   Review of Systems Review of Systems  Constitutional: Positive for chills and fever.  HENT: Positive for congestion and rhinorrhea. Negative for ear pain and sore throat.   Respiratory: Positive for cough and shortness of breath. Negative for chest tightness.   Cardiovascular: Negative for chest pain.  Gastrointestinal: Negative for abdominal pain, diarrhea, nausea and vomiting.  Genitourinary: Negative for dysuria.  Musculoskeletal: Positive for back pain and myalgias.  Skin: Negative for color change and rash.  Neurological: Negative for dizziness, syncope, light-headedness and headaches.     Physical Exam Updated Vital Signs BP (!) 144/97 (BP Location: Left Arm)   Pulse 81   Temp 98.9 F (37.2 C) (Oral)   Resp 18   SpO2 100%   Physical Exam Vitals signs and nursing note reviewed.  Constitutional:      General: He is not in acute distress.    Appearance: Normal appearance. He is well-developed and normal weight. He is not ill-appearing or diaphoretic.  HENT:     Head: Normocephalic and atraumatic.     Nose:     Comments: Bilateral nares patent with moderate mucosal edema and clear rhinorrhea present.  Mouth/Throat:     Mouth: Mucous membranes are moist.     Pharynx: Oropharynx is clear.     Comments: Posterior oropharynx clear and mucous membranes moist, there is mild erythema but no edema or tonsillar exudates, uvula midline, normal phonation, no trismus, tolerating secretions without difficulty. Eyes:     General:        Right eye: No discharge.        Left eye: No discharge.  Neck:     Musculoskeletal: Neck supple.     Comments: No rigidity Cardiovascular:     Rate and Rhythm: Normal rate and regular rhythm.     Heart sounds: Normal heart sounds. No murmur. No friction rub. No gallop.   Pulmonary:      Effort: Pulmonary effort is normal. No respiratory distress.     Breath sounds: Normal breath sounds. No wheezing or rales.     Comments: Respirations equal and unlabored, patient able to speak in full sentences, lungs clear to auscultation bilaterally Abdominal:     General: Bowel sounds are normal. There is no distension.     Palpations: Abdomen is soft. There is no mass.     Tenderness: There is no abdominal tenderness. There is no guarding.     Comments: Abdomen soft, nondistended, nontender to palpation in all quadrants without guarding or peritoneal signs  Musculoskeletal:        General: No deformity.  Lymphadenopathy:     Cervical: No cervical adenopathy.  Skin:    General: Skin is warm and dry.     Capillary Refill: Capillary refill takes less than 2 seconds.  Neurological:     Mental Status: He is alert.     Coordination: Coordination normal.     Comments: Speech is clear, able to follow commands Moves extremities without ataxia, coordination intact  Psychiatric:        Mood and Affect: Mood normal.        Behavior: Behavior normal.      ED Treatments / Results  Labs (all labs ordered are listed, but only abnormal results are displayed) Labs Reviewed  SARS CORONAVIRUS 2 (HOSPITAL ORDER, [facility]) - Abnormal; Notable for the following components:      Result Value   SARS Coronavirus 2 POSITIVE (*)    All other components within normal limits    EKG None  Radiology Dg Chest Port 1 View  Result Date: 10/17/2018 CLINICAL DATA:  Chest congestion. EXAM: PORTABLE CHEST 1 VIEW COMPARISON:  None. FINDINGS: The cardiomediastinal contours are normal. The lungs are clear. Pulmonary vasculature is normal. No consolidation, pleural effusion, or pneumothorax. No acute osseous abnormalities are seen. IMPRESSION: Negative AP radiographs of the chest. Electronically Signed   By: Keith Rake M.D.   On: 10/17/2018 23:39    Procedures Procedures  (including critical care time)  Medications Ordered in ED Medications - No data to display   Initial Impression / Assessment and Plan / ED Course  I have reviewed the triage vital signs and the nursing notes.  Pertinent labs & imaging results that were available during my care of the patient were reviewed by me and considered in my medical decision making (see chart for details).  Pt presents with nasal congestion, cough and chest congestion.  Patient disinfects commercial aircraft for work and is concerned he may have been exposed to COVID-19, no other known exposures. Pt is well appearing and vitals are normal. Lungs CTA on exam with normal work  of breathing. Pt CXR negative for acute infiltrate.  Will send COVID-19 test, patient has pregnant wife at home who has not yet been exposed to him as she has been out of town and is supposed to return in the morning he would like to know as soon as possible if he is positive for COVID-19 so he can make arrangements to avoid exposing his wife and daughter.  Will plan to call patient with rapid test results.  Discussed that antibiotics are not indicated for viral infections. Pt will be discharged with symptomatic treatment.  I have also encouraged him to purchase home pulse ox.  Verbalizes understanding and is agreeable with plan. Pt is hemodynamically stable & in NAD prior to dc. Return precautions discussed, pt expresses understanding and agrees with plan.  Craig Malone was evaluated in Emergency Department on 10/18/2018 for the symptoms described in the history of present illness. He was evaluated in the context of the global COVID-19 pandemic, which necessitated consideration that the patient might be at risk for infection with the SARS-CoV-2 virus that causes COVID-19. Institutional protocols and algorithms that pertain to the evaluation of patients at risk for COVID-19 are in a state of rapid change based on information released by regulatory bodies  including the CDC and federal and state organizations. These policies and algorithms were followed during the patient's care in the ED.  1:40 AM called and spoke to patient over the phone regarding positive COVID-19 test results, again reinforced supportive care at home and strict return precautions.  Patient will make arrangements to avoid exposing his wife and child and will continue home quarantine.  Final Clinical Impressions(s) / ED Diagnoses   Final diagnoses:  Suspected Covid-19 Virus Infection  Chest congestion  Myalgia    ED Discharge Orders    None       Legrand RamsFord, Allex Lapoint N, PA-C 10/18/18 0516    Dione BoozeGlick, David, MD 10/18/18 0630

## 2018-10-17 NOTE — ED Triage Notes (Signed)
Pt reports fever, chest congestion and lower back pain since the weekend. 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 Temp as high as 99.6 today.

## 2018-10-18 LAB — SARS CORONAVIRUS 2 BY RT PCR (HOSPITAL ORDER, PERFORMED IN ~~LOC~~ HOSPITAL LAB): SARS Coronavirus 2: POSITIVE — AB

## 2018-10-18 NOTE — ED Notes (Signed)
Please see EDP note for further evaluation and assessment 

## 2018-10-24 ENCOUNTER — Telehealth: Payer: Self-pay | Admitting: Adult Health

## 2018-10-24 DIAGNOSIS — Z20822 Contact with and (suspected) exposure to covid-19: Secondary | ICD-10-CM

## 2018-10-24 NOTE — Telephone Encounter (Signed)
Please advise if this request is appropriate.  Pt's spouse, Sanad Fearnow, tested negative on 10/18/2018.  Charyl Bigger, CMA

## 2018-10-24 NOTE — Telephone Encounter (Signed)
Patient tested positive for COVID a few weeks ago at ED, and needs a confirmed neg test prior to work allowing him to come back. We also sent an order for his wife Craig Malone (DOB 06/05/87) which came back neg. Patient is requesting new orders for COVID test for him and his wife to confirm they are COVID free prior to letting their 32 year old daughter come back home from her grandparents. Please place order if applicable and let front staff know to contact patient

## 2018-10-24 NOTE — Telephone Encounter (Signed)
Okay to order?

## 2018-10-25 ENCOUNTER — Other Ambulatory Visit: Payer: Self-pay | Admitting: Adult Health

## 2018-10-25 DIAGNOSIS — Z20822 Contact with and (suspected) exposure to covid-19: Secondary | ICD-10-CM

## 2018-10-25 NOTE — Telephone Encounter (Signed)
Order placed.  Pt informed of testing site and hours.  Charyl Bigger, CMA

## 2018-10-25 NOTE — Addendum Note (Signed)
Addended by: Fonnie Mu on: 10/25/2018 09:11 AM   Modules accepted: Orders

## 2018-10-27 LAB — NOVEL CORONAVIRUS, NAA: SARS-CoV-2, NAA: DETECTED — AB

## 2018-11-01 ENCOUNTER — Other Ambulatory Visit: Payer: BLUE CROSS/BLUE SHIELD

## 2018-11-03 ENCOUNTER — Other Ambulatory Visit: Payer: Self-pay

## 2018-11-03 DIAGNOSIS — Z20822 Contact with and (suspected) exposure to covid-19: Secondary | ICD-10-CM

## 2018-11-05 LAB — NOVEL CORONAVIRUS, NAA: SARS-CoV-2, NAA: NOT DETECTED

## 2018-11-13 ENCOUNTER — Other Ambulatory Visit: Payer: Self-pay

## 2018-11-13 ENCOUNTER — Other Ambulatory Visit: Payer: BLUE CROSS/BLUE SHIELD

## 2018-11-13 DIAGNOSIS — Z0000 Encounter for general adult medical examination without abnormal findings: Secondary | ICD-10-CM

## 2018-11-14 ENCOUNTER — Encounter: Payer: Self-pay | Admitting: Adult Health

## 2018-11-14 LAB — HEMOGLOBIN A1C
Est. average glucose Bld gHb Est-mCnc: 108 mg/dL
Hgb A1c MFr Bld: 5.4 % (ref 4.8–5.6)

## 2018-11-14 NOTE — Progress Notes (Signed)
Subjective:    Patient ID: Craig Malone, male    DOB: 07/26/1985, 33 y.o.   MRN: [id number]  HPI:  Mr. Craig Malone is here for CPE He denies acute issues He was SARS-CoV-2 +, most recent test on 11/03/2018 was NEGATIVE He has resumed HIIT/Crossfit exercise: 4 days/week He estimates to drink >100 oz water/day, follows heart healthy diet He denies tobacco/vape/ETOH  10/13/2018- IMPRESSION: Negative brain MRI.  Lab Results  Component Value Date   HGBA1C 5.4 11/13/2018   HGBA1C 5.3 09/18/2018   HGBA1C 5.5 07/04/2017   Needs fasting labs!  Healthcare Maintenance: Immunizations-UTD  Patient Care Team    Relationship Specialty Notifications Start End  PalermoDanford, Jinny BlossomKaty D, NP PCP - General Family Medicine  06/14/17     Patient Active Problem List   Diagnosis Date Noted  . Headache 09/18/2018  . Low HDL (under 40) 07/07/2017  . Healthcare maintenance 06/14/2017     History reviewed. No pertinent past medical history.   Past Surgical History:  Procedure Laterality Date  . arm surgery    . CLOSED REDUCTION ELBOW FRACTURE Left   . HERNIA REPAIR       Family History  Problem Relation Age of Onset  . Diabetes Paternal Grandmother   . Diabetes Paternal Grandfather      Social History   Substance and Sexual Activity  Drug Use No     Social History   Substance and Sexual Activity  Alcohol Use No  . Alcohol/week: 0.0 standard drinks     Social History   Tobacco Use  Smoking Status Never Smoker  Smokeless Tobacco Never Used     No outpatient encounter medications on file as of 11/15/2018.   No facility-administered encounter medications on file as of 11/15/2018.     Allergies: Patient has no known allergies.  Body mass index is 23.07 kg/m.  Blood pressure 108/67, pulse 80, temperature 98.8 F (37.1 C), temperature source Oral, height 5\' 10"  (1.778 m), weight 160 lb 12.8 oz (72.9 kg), SpO2 98 %.  Review of Systems  Constitutional: Negative for  activity change, appetite change, chills, diaphoresis, fatigue, fever and unexpected weight change.  HENT: Negative for congestion.   Eyes: Negative for visual disturbance.  Respiratory: Negative for cough, chest tightness, shortness of breath, wheezing and stridor.   Cardiovascular: Negative for chest pain, palpitations and leg swelling.  Gastrointestinal: Negative for abdominal distention, anal bleeding, blood in stool, constipation, diarrhea, nausea and vomiting.  Endocrine: Negative for cold intolerance, heat intolerance, polydipsia, polyphagia and polyuria.  Genitourinary: Negative for difficulty urinating and frequency.  Musculoskeletal: Negative for arthralgias, back pain, gait problem, joint swelling, myalgias, neck pain and neck stiffness.  Skin: Negative for color change, pallor, rash and wound.  Neurological: Negative for dizziness and headaches.  Hematological: Negative for adenopathy. Does not bruise/bleed easily.  Psychiatric/Behavioral: Negative for agitation, behavioral problems, confusion, decreased concentration, dysphoric mood, hallucinations, self-injury, sleep disturbance and suicidal ideas. The patient is not nervous/anxious and is not hyperactive.        Objective:   Physical Exam Vitals signs and nursing note reviewed.  Constitutional:      General: He is not in acute distress.    Appearance: He is normal weight. He is not ill-appearing, toxic-appearing or diaphoretic.  HENT:     Head: Normocephalic.     Right Ear: Tympanic membrane, ear canal and external ear normal. There is no impacted cerumen.     Left Ear: Tympanic membrane, ear canal and external ear  normal.     Nose: Nose normal. No congestion.     Mouth/Throat:     Mouth: Mucous membranes are moist.  Eyes:     Extraocular Movements: Extraocular movements intact.     Conjunctiva/sclera: Conjunctivae normal.     Pupils: Pupils are equal, round, and reactive to light.  Neck:     Musculoskeletal: Normal  range of motion.  Cardiovascular:     Rate and Rhythm: Normal rate and regular rhythm.     Pulses: Normal pulses.     Heart sounds: Normal heart sounds. No murmur. No friction rub. No gallop.   Pulmonary:     Effort: Pulmonary effort is normal. No respiratory distress.     Breath sounds: Normal breath sounds. No stridor. No wheezing, rhonchi or rales.  Chest:     Chest wall: No tenderness.  Abdominal:     General: Abdomen is flat. Bowel sounds are normal. There is no distension.     Palpations: Abdomen is soft. There is no mass.     Tenderness: There is no abdominal tenderness. There is no right CVA tenderness, left CVA tenderness, guarding or rebound.     Hernia: No hernia is present.  Musculoskeletal: Normal range of motion.        General: No tenderness.  Skin:    General: Skin is warm and dry.     Capillary Refill: Capillary refill takes less than 2 seconds.  Neurological:     Mental Status: He is alert and oriented to person, place, and time.     Coordination: Coordination normal.  Psychiatric:        Mood and Affect: Mood normal.        Behavior: Behavior normal.        Thought Content: Thought content normal.        Judgment: Judgment normal.       Assessment & Plan:   1. Healthcare maintenance   2. Acute nonintractable headache, unspecified headache type     Healthcare maintenance  Overall you are doing GREAT! Remain well hydrated, follow Heart Healthy Diet, and continue regular exercise. A1c- 5.4, normal Please schedule fasting lab appt next week- we will call you with the results. Please consider donating plasma for Corona Virus research- [employer] Blood Website  And [email or web address].gov Recommend annual physical with fasting labs the week prior. Continue to social distance and wear a mask when in public.  Headache Sx's Resolved 10/13/2018- IMPRESSION: Negative brain MRI.    FOLLOW-UP:  Return in about 1 year (around 11/15/2019).

## 2018-11-15 ENCOUNTER — Other Ambulatory Visit: Payer: Self-pay

## 2018-11-15 ENCOUNTER — Encounter: Payer: Self-pay | Admitting: Adult Health

## 2018-11-15 ENCOUNTER — Ambulatory Visit (INDEPENDENT_AMBULATORY_CARE_PROVIDER_SITE_OTHER): Payer: BLUE CROSS/BLUE SHIELD | Admitting: Adult Health

## 2018-11-15 DIAGNOSIS — R51 Headache: Secondary | ICD-10-CM

## 2018-11-15 DIAGNOSIS — Z0000 Encounter for general adult medical examination without abnormal findings: Secondary | ICD-10-CM

## 2018-11-15 DIAGNOSIS — R519 Headache, unspecified: Secondary | ICD-10-CM

## 2018-11-15 NOTE — Patient Instructions (Addendum)

## 2018-11-15 NOTE — Assessment & Plan Note (Signed)
  Overall you are doing GREAT! Remain well hydrated, follow Heart Healthy Diet, and continue regular exercise. A1c- 5.4, normal Please schedule fasting lab appt next week- we will call you with the results. Please consider donating plasma for Corona Virus research- [employer] Blood Website  And [email or web address].gov Recommend annual physical with fasting labs the week prior. Continue to social distance and wear a mask when in public.

## 2018-11-15 NOTE — Assessment & Plan Note (Signed)
Sx's Resolved 10/13/2018- IMPRESSION: Negative brain MRI.

## 2018-11-27 ENCOUNTER — Other Ambulatory Visit: Payer: BLUE CROSS/BLUE SHIELD

## 2018-11-27 ENCOUNTER — Other Ambulatory Visit: Payer: Self-pay

## 2018-11-27 DIAGNOSIS — Z0000 Encounter for general adult medical examination without abnormal findings: Secondary | ICD-10-CM

## 2018-11-27 DIAGNOSIS — E786 Lipoprotein deficiency: Secondary | ICD-10-CM

## 2018-11-28 ENCOUNTER — Encounter: Payer: Self-pay | Admitting: Adult Health

## 2018-11-28 LAB — CBC WITH DIFFERENTIAL/PLATELET
Basophils Absolute: 0 10*3/uL (ref 0.0–0.2)
Basos: 1 %
EOS (ABSOLUTE): 0.2 10*3/uL (ref 0.0–0.4)
Eos: 4 %
Hematocrit: 45.8 % (ref 37.5–51.0)
Hemoglobin: 15.4 g/dL (ref 13.0–17.7)
Immature Grans (Abs): 0 10*3/uL (ref 0.0–0.1)
Immature Granulocytes: 0 %
Lymphocytes Absolute: 2.2 10*3/uL (ref 0.7–3.1)
Lymphs: 43 %
MCH: 30.7 pg (ref 26.6–33.0)
MCHC: 33.6 g/dL (ref 31.5–35.7)
MCV: 91 fL (ref 79–97)
Monocytes Absolute: 0.4 10*3/uL (ref 0.1–0.9)
Monocytes: 9 %
Neutrophils Absolute: 2.2 10*3/uL (ref 1.4–7.0)
Neutrophils: 43 %
Platelets: 188 10*3/uL (ref 150–450)
RBC: 5.01 x10E6/uL (ref 4.14–5.80)
RDW: 12 % (ref 11.6–15.4)
WBC: 5 10*3/uL (ref 3.4–10.8)

## 2018-11-28 LAB — LIPID PANEL
Chol/HDL Ratio: 3.3 ratio (ref 0.0–5.0)
Cholesterol, Total: 157 mg/dL (ref 100–199)
HDL: 47 mg/dL (ref >39)
LDL Chol Calc (NIH): 99 mg/dL (ref 0–99)
Triglycerides: 53 mg/dL (ref 0–149)
VLDL Cholesterol Cal: 11 mg/dL (ref 5–40)

## 2018-11-28 LAB — COMPREHENSIVE METABOLIC PANEL
ALT: 19 IU/L (ref 0–44)
AST: 16 IU/L (ref 0–40)
Albumin/Globulin Ratio: 2.1 (ref 1.2–2.2)
Albumin: 4.7 g/dL (ref 4.0–5.0)
Alkaline Phosphatase: 41 IU/L (ref 39–117)
BUN/Creatinine Ratio: 13 (ref 9–20)
BUN: 12 mg/dL (ref 6–20)
Bilirubin Total: 0.6 mg/dL (ref 0.0–1.2)
CO2: 22 mmol/L (ref 20–29)
Calcium: 9.2 mg/dL (ref 8.7–10.2)
Chloride: 103 mmol/L (ref 96–106)
Creatinine, Ser: 0.92 mg/dL (ref 0.76–1.27)
GFR calc Af Amer: 127 mL/min/{1.73_m2} (ref >59)
GFR calc non Af Amer: 110 mL/min/{1.73_m2} (ref >59)
Globulin, Total: 2.2 g/dL (ref 1.5–4.5)
Glucose: 98 mg/dL (ref 65–99)
Potassium: 4.5 mmol/L (ref 3.5–5.2)
Sodium: 139 mmol/L (ref 134–144)
Total Protein: 6.9 g/dL (ref 6.0–8.5)

## 2018-11-28 LAB — TSH: TSH: 0.938 u[IU]/mL (ref 0.450–4.500)

## 2018-12-28 DIAGNOSIS — Z833 Family history of diabetes mellitus: Secondary | ICD-10-CM | POA: Insufficient documentation

## 2020-04-24 ENCOUNTER — Other Ambulatory Visit: Payer: Self-pay

## 2020-04-24 ENCOUNTER — Ambulatory Visit
Admission: EM | Admit: 2020-04-24 | Discharge: 2020-04-24 | Disposition: A | Discharge status: Home / Self Care | Payer: BLUE CROSS/BLUE SHIELD | Attending: Urgent Care | Admitting: Urgent Care

## 2020-04-24 DIAGNOSIS — S61411A Laceration without foreign body of right hand, initial encounter: Secondary | ICD-10-CM | POA: Diagnosis not present

## 2020-04-24 DIAGNOSIS — M79641 Pain in right hand: Secondary | ICD-10-CM

## 2020-04-24 MED ORDER — NAPROXEN 500 MG PO TABS: 500.0000 mg | ORAL_TABLET | Freq: Two times a day (BID) | ORAL | 0 refills | Status: DC

## 2020-04-24 NOTE — Discharge Instructions (Signed)

## 2020-04-24 NOTE — ED Triage Notes (Signed)
Patient states he was replacing his chain saw blade and his hand slipped and drug across the blade causing a laceration. Pt is aox4 and ambulatory.

## 2020-04-24 NOTE — ED Provider Notes (Signed)
[facility]    MRN: [id number] DOB: 1985/11/04  Subjective:   Craig Malone is a 35 y.o. male presenting for suffering a laceration today while trying to replace a chainsaw blade.  Patient lost control of it, likely was not on.  Patient had his Tdap updated in the past 2 years.  Denies loss of range of motion, weakness, numbness or tingling.  No current facility-administered medications for this encounter.  Current Outpatient Medications:  .  naproxen (NAPROSYN) 500 MG tablet, Take 1 tablet (500 mg total) by mouth 2 (two) times daily with a meal., Disp: 30 tablet, Rfl: 0   No Known Allergies  History reviewed. No pertinent past medical history.   Past Surgical History:  Procedure Laterality Date  . arm surgery    . CLOSED REDUCTION ELBOW FRACTURE Left   . HERNIA REPAIR      Family History  Problem Relation Age of Onset  . Diabetes Paternal Grandmother   . Diabetes Paternal Grandfather     Social History   Tobacco Use  . Smoking status: Never Smoker  . Smokeless tobacco: Never Used  Vaping Use  . Vaping Use: Never used  Substance Use Topics  . Alcohol use: No    Alcohol/week: 0.0 standard drinks  . Drug use: No    ROS   Objective:   Vitals: BP 114/74 (BP Location: Left Arm)   Pulse 79   Temp 98.2 F (36.8 C)   Resp 17   SpO2 99%   Physical Exam Constitutional:      General: He is not in acute distress.    Appearance: Normal appearance. He is well-developed and normal weight. He is not ill-appearing, toxic-appearing or diaphoretic.  HENT:     Head: Normocephalic and atraumatic.     Right Ear: External ear normal.     Left Ear: External ear normal.     Nose: Nose normal.     Mouth/Throat:     Pharynx: Oropharynx is clear.  Eyes:     General: No scleral icterus.       Right eye: No discharge.        Left eye: No discharge.     Extraocular Movements: Extraocular movements intact.     Pupils: Pupils are equal, round, and reactive to  light.  Cardiovascular:     Rate and Rhythm: Normal rate.  Pulmonary:     Effort: Pulmonary effort is normal.  Musculoskeletal:       Hands:     Cervical back: Normal range of motion.  Neurological:     Mental Status: He is alert and oriented to person, place, and time.  Psychiatric:        Mood and Affect: Mood normal.        Behavior: Behavior normal.        Thought Content: Thought content normal.        Judgment: Judgment normal.     PROCEDURE NOTE: laceration repair Verbal consent obtained from patient.  Local anesthesia with 5cc Lidocaine 1% with epinephrine.  Wound explored for tendon, ligament damage. Wound scrubbed with soap and water and rinsed. Wound closed with #5 4-0 Prolene (3 central horizontal mattress sutures with the simple interrupted on either side) sutures.  Wound cleansed and dressed.   Assessment and Plan :   PDMP not reviewed this encounter.  1. Right hand pain   2. Laceration of right hand without foreign body, initial encounter     Laceration repaired successfully.  Counseled on wound care.  Use naproxen for pain and inflammation.  Return to clinic in 10 days for suture removal. Counseled patient on potential for adverse effects with medications prescribed/recommended today, ER and return-to-clinic precautions discussed, patient verbalized understanding.    Wallis Bamberg, PA-C 04/24/20 1438

## 2020-11-03 IMAGING — DX PORTABLE CHEST - 1 VIEW
1 series · 2 of 2 positions shown · non-contrast
Comparison: None.

CLINICAL DATA: Chest congestion.

EXAM:
PORTABLE CHEST 1 VIEW

[Series 1: chest · 0.14mm/px · 2 of 2 slices shown]
[im 1/2]
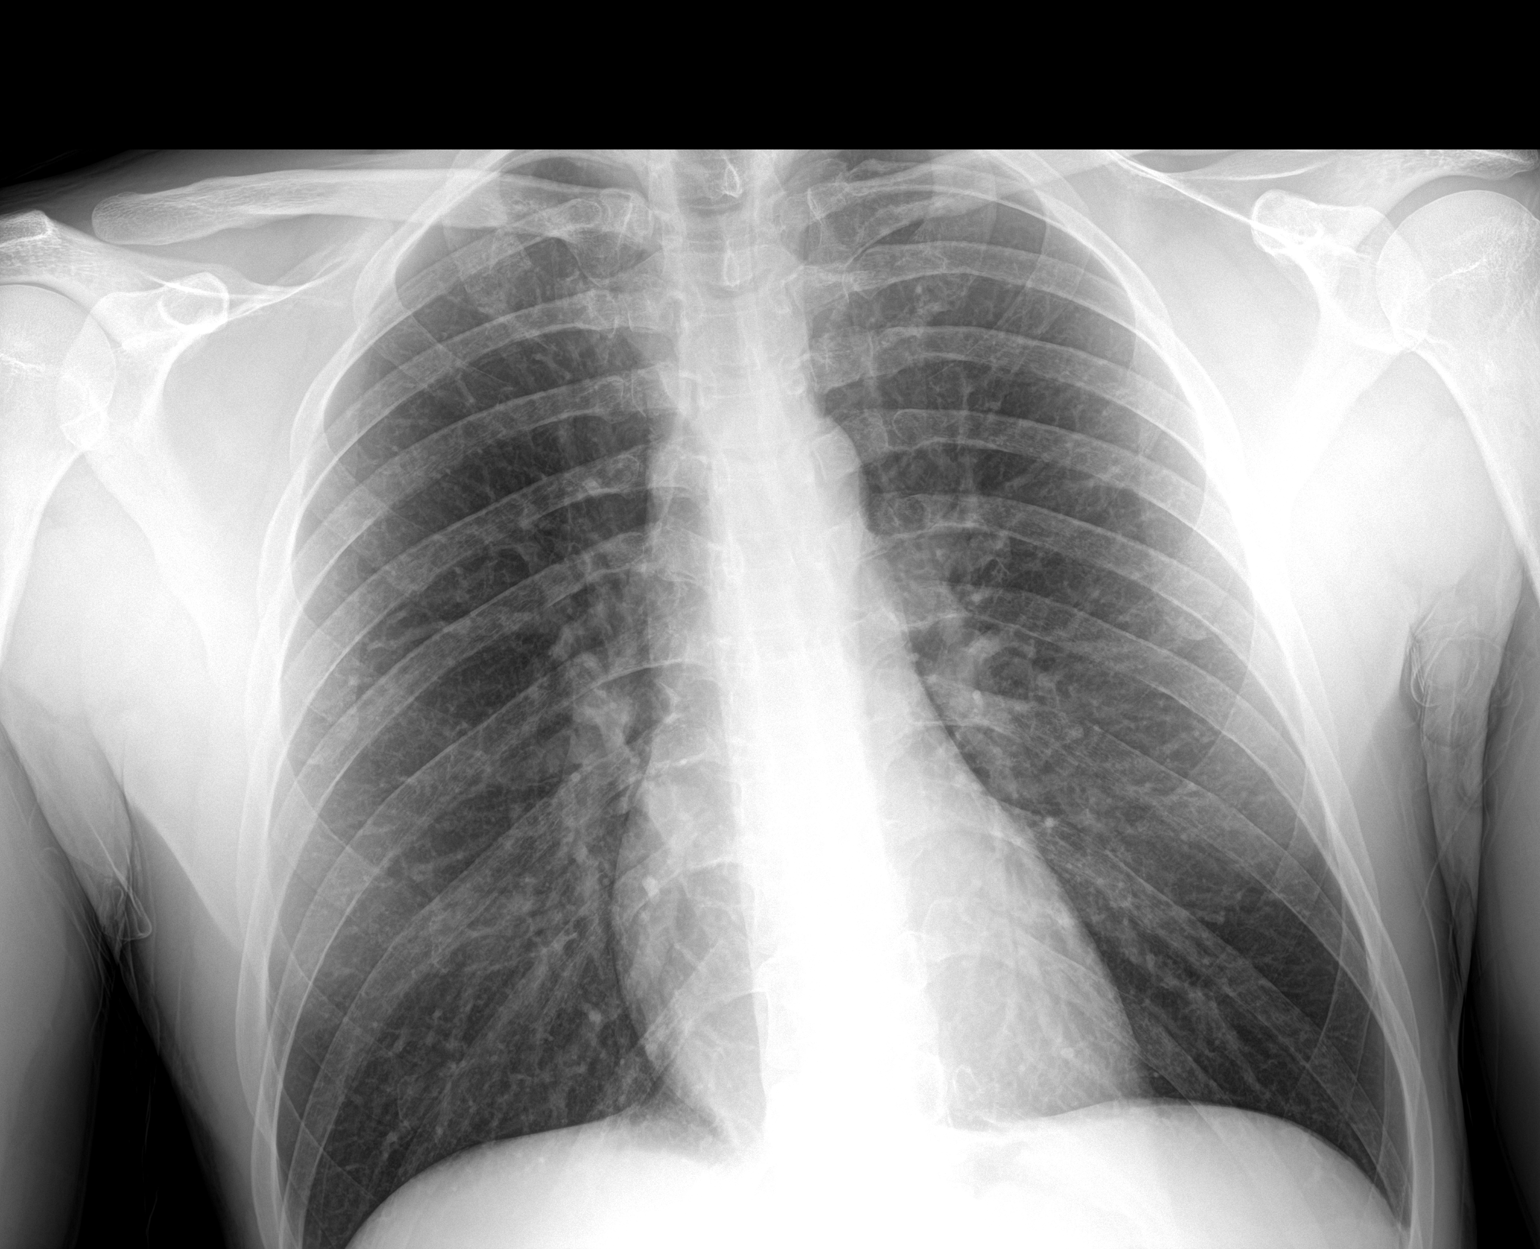
[im 2/2]
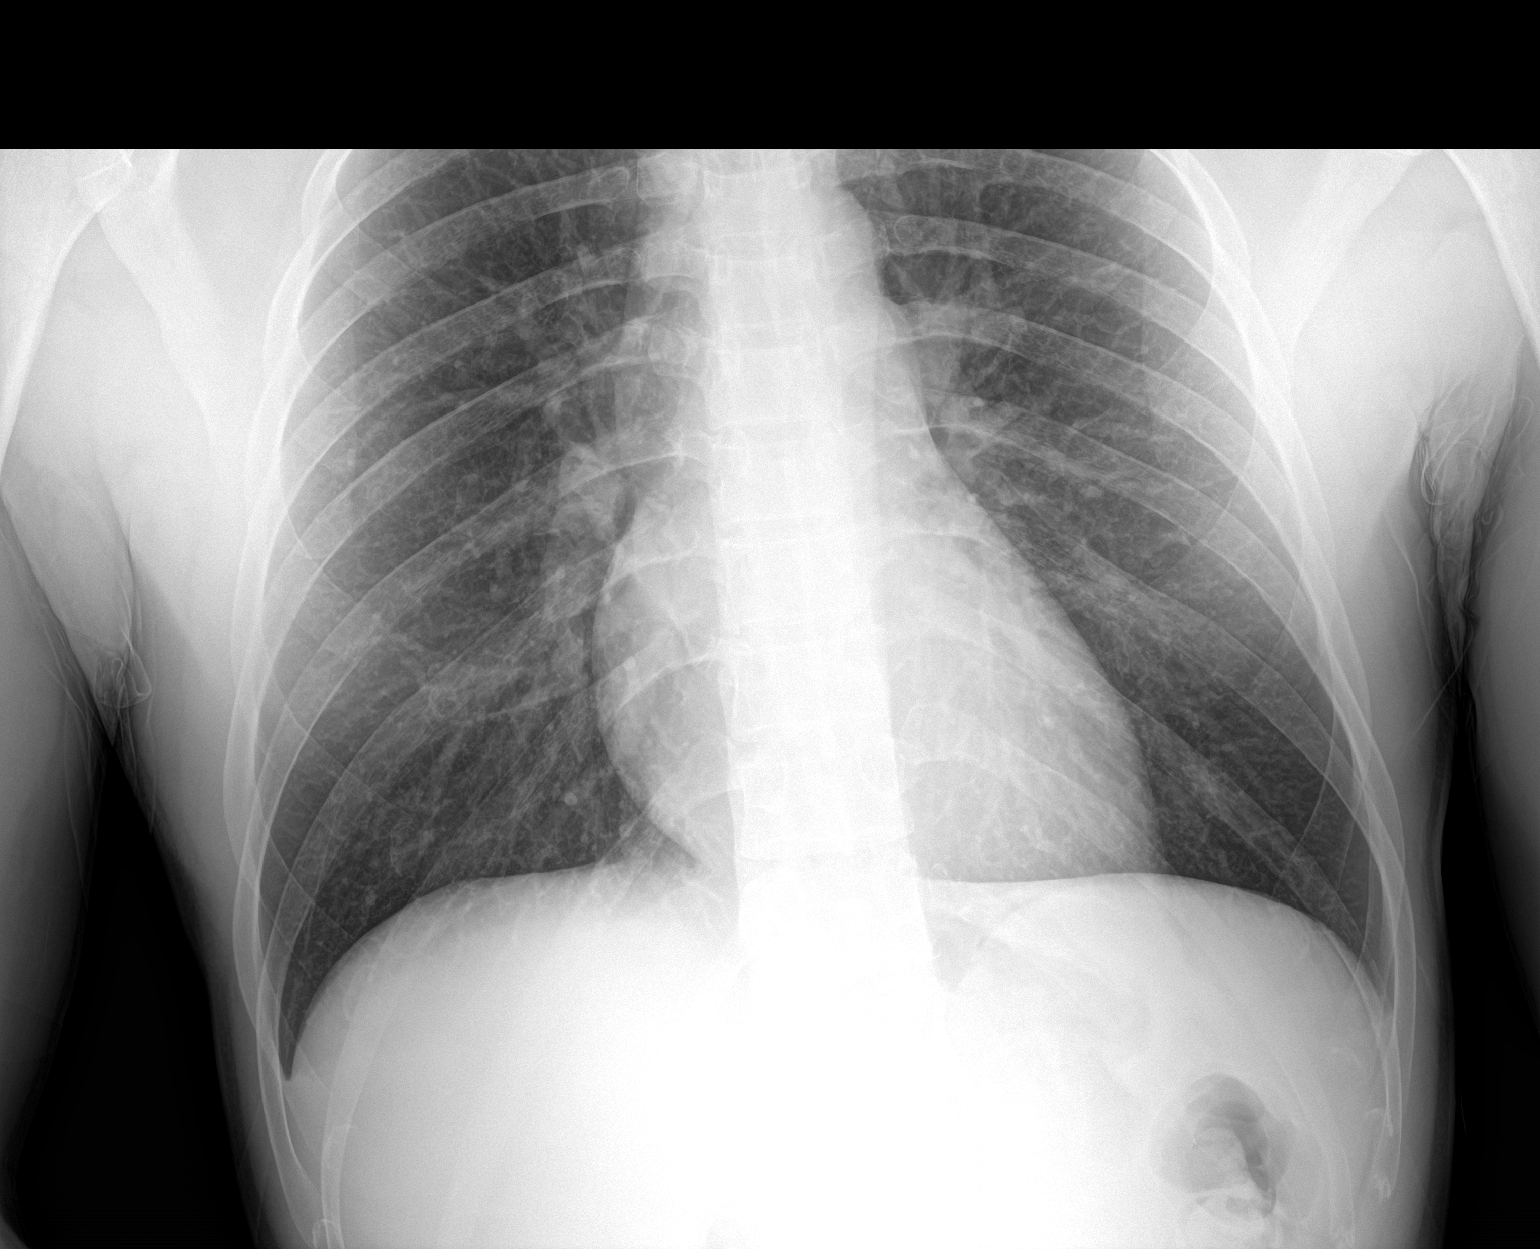

[2 of 2 positions shown; findings below may reference images not displayed]

FINDINGS: The cardiomediastinal contours are normal. The lungs are clear.
Pulmonary vasculature is normal. No consolidation, pleural effusion,
or pneumothorax. No acute osseous abnormalities are seen.
IMPRESSION: Negative AP radiographs of the chest.

## 2023-04-22 ENCOUNTER — Encounter: Payer: Self-pay | Admitting: *Deleted

## 2023-04-22 ENCOUNTER — Ambulatory Visit
Admission: EM | Admit: 2023-04-22 | Discharge: 2023-04-22 | Disposition: A | Discharge status: Home / Self Care | Payer: BC Managed Care – PPO | Attending: Family | Admitting: Family

## 2023-04-22 ENCOUNTER — Other Ambulatory Visit: Payer: Self-pay

## 2023-04-22 ENCOUNTER — Ambulatory Visit: Payer: BC Managed Care – PPO

## 2023-04-22 DIAGNOSIS — R051 Acute cough: Secondary | ICD-10-CM

## 2023-04-22 DIAGNOSIS — J0140 Acute pansinusitis, unspecified: Secondary | ICD-10-CM

## 2023-04-22 DIAGNOSIS — J209 Acute bronchitis, unspecified: Secondary | ICD-10-CM

## 2023-04-22 MED ORDER — AMOXICILLIN-POT CLAVULANATE 875-125 MG PO TABS: 1.0000 | ORAL_TABLET | Freq: Two times a day (BID) | ORAL | 0 refills | Status: AC

## 2023-04-22 MED ORDER — BENZONATATE 200 MG PO CAPS: 200.0000 mg | ORAL_CAPSULE | Freq: Three times a day (TID) | ORAL | 0 refills | Status: AC | PRN

## 2023-04-22 NOTE — Discharge Instructions (Signed)
Recommend start Augmentin 875mg  twice a day for 7 days with food. May take Tessalon cough pills 1 every 8 hours as needed. Continue to push fluids to help loosen up mucus in sinuses and chest. May also take OTC Delsym 10ml every 12 hours as needed for cough. Follow-up in 4 to 5 days if not improving.

## 2023-04-22 NOTE — ED Triage Notes (Addendum)
Cough, low back pain, congestion, chills, subjective fever x 5 days. States symptoms seem to be improving but the cough is bothersome

## 2023-04-22 NOTE — ED Provider Notes (Signed)
[facility]    CSN: [id number] Arrival date & time: 04/22/23  0858      History   Chief Complaint Chief Complaint  Patient presents with   Cough    HPI Craig Malone is a 38 y.o. male.   38 year old male presents with persistent cough for over 1 month. Started with some congestion and cough, was starting to improve then last week started developing a low grade fever with chills, sweating, body aches. Mucus is now discolored. Still some sinus congestion and pressure but most of the congestion is in his chest. Denies any GI symptoms. He has taken Ibuprofen and Tylenol with some relief for fever. Has not tried any cough medication. No other family members ill. No other chronic health issues. Takes no daily medication.   The history is provided by the patient.    History reviewed. No pertinent past medical history.  Patient Active Problem List   Diagnosis Date Noted   Family history of diabetes mellitus 12/28/2018   Headache 09/18/2018   Low HDL (under 40) 07/07/2017   Healthcare maintenance 06/14/2017    Past Surgical History:  Procedure Laterality Date   arm surgery     CLOSED REDUCTION ELBOW FRACTURE Left    HERNIA REPAIR         Home Medications    Prior to Admission medications   Medication Sig Start Date End Date Taking? Authorizing Provider  amoxicillin-clavulanate (AUGMENTIN) 875-125 MG tablet Take 1 tablet by mouth every 12 (twelve) hours for 7 days. 04/22/23 04/29/23 Yes Shawnia Vizcarrondo, Ali Lowe, NP  benzonatate (TESSALON) 200 MG capsule Take 1 capsule (200 mg total) by mouth every 8 (eight) hours as needed for cough. 04/22/23  Yes Ladarren Steiner, Ali Lowe, NP    Family History Family History  Problem Relation Age of Onset   Diabetes Paternal Grandmother    Diabetes Paternal Grandfather     Social History Social History   Tobacco Use   Smoking status: Never   Smokeless tobacco: Never  Vaping Use   Vaping status: Never Used  Substance Use Topics    Alcohol use: No    Alcohol/week: 0.0 standard drinks of alcohol   Drug use: No     Allergies   Patient has no known allergies.   Review of Systems Review of Systems  Constitutional:  Positive for chills, diaphoresis, fatigue and fever. Negative for activity change and appetite change.  HENT:  Positive for congestion, postnasal drip and sinus pressure. Negative for ear discharge, ear pain, mouth sores, sinus pain, sore throat and trouble swallowing.   Eyes:  Negative for discharge, redness and itching.  Respiratory:  Positive for cough, chest tightness and wheezing (during cough). Negative for shortness of breath.   Gastrointestinal:  Negative for abdominal pain, nausea and vomiting.  Musculoskeletal:  Positive for arthralgias and myalgias. Negative for neck pain and neck stiffness.  Skin:  Negative for color change and rash.  Allergic/Immunologic: Negative for environmental allergies, food allergies and immunocompromised state.  Neurological:  Negative for dizziness, tremors, seizures, syncope, speech difficulty, light-headedness and numbness.  Hematological:  Negative for adenopathy. Does not bruise/bleed easily.     Physical Exam Triage Vital Signs ED Triage Vitals  Encounter Vitals Group     BP 04/22/23 1022 119/82     Systolic BP Percentile --      Diastolic BP Percentile --      Pulse Rate 04/22/23 1022 79     Resp 04/22/23 1022 18  Temp 04/22/23 1022 98 F (36.7 C)     Temp Source 04/22/23 1022 Oral     SpO2 04/22/23 1022 98 %     Weight --      Height --      Head Circumference --      Peak Flow --      Pain Score 04/22/23 1020 5     Pain Loc --      Pain Education --      Exclude from Growth Chart --    No data found.  Updated Vital Signs BP 119/82 (BP Location: Left Arm)   Pulse 79   Temp 98 F (36.7 C) (Oral)   Resp 18   SpO2 98%   Visual Acuity Right Eye Distance:   Left Eye Distance:   Bilateral Distance:    Right Eye Near:   Left Eye  Near:    Bilateral Near:     Physical Exam Vitals and nursing note reviewed.  Constitutional:      General: He is awake. He is not in acute distress.    Appearance: He is well-developed and well-groomed. He is ill-appearing.     Comments: He is sitting on the exam table in no acute distress but appears tired and ill.   HENT:     Head: Normocephalic and atraumatic.     Right Ear: Hearing, tympanic membrane, ear canal and external ear normal.     Left Ear: Hearing, tympanic membrane, ear canal and external ear normal.     Nose: Congestion present.     Right Sinus: No maxillary sinus tenderness or frontal sinus tenderness.     Left Sinus: No maxillary sinus tenderness or frontal sinus tenderness.     Mouth/Throat:     Lips: Pink.     Mouth: Mucous membranes are moist.     Pharynx: Uvula midline. Postnasal drip present. No pharyngeal swelling, oropharyngeal exudate, posterior oropharyngeal erythema or uvula swelling.  Eyes:     Conjunctiva/sclera: Conjunctivae normal.  Cardiovascular:     Rate and Rhythm: Normal rate and regular rhythm.     Heart sounds: Normal heart sounds. No murmur heard. Pulmonary:     Effort: Pulmonary effort is normal. No tachypnea or respiratory distress.     Breath sounds: Normal air entry. No decreased air movement. Examination of the right-upper field reveals wheezing and rhonchi. Examination of the left-upper field reveals wheezing. Examination of the right-middle field reveals wheezing and rhonchi. Examination of the right-lower field reveals wheezing. Examination of the left-lower field reveals wheezing. Wheezing and rhonchi present. No decreased breath sounds or rales.     Comments: Coarse breath sounds in right mid to upper lung field, especially with coughing.  Musculoskeletal:     Cervical back: Normal range of motion and neck supple.  Lymphadenopathy:     Cervical: No cervical adenopathy.  Skin:    General: Skin is warm and dry.     Capillary Refill:  Capillary refill takes less than 2 seconds.     Findings: No rash.  Neurological:     General: No focal deficit present.     Mental Status: He is alert and oriented to person, place, and time.  Psychiatric:        Mood and Affect: Mood normal.        Behavior: Behavior normal. Behavior is cooperative.        Thought Content: Thought content normal.        Judgment: Judgment normal.  UC Treatments / Results  Labs (all labs ordered are listed, but only abnormal results are displayed) Labs Reviewed - No data to display  EKG   Radiology DG Chest 2 View Result Date: 04/22/2023 CLINICAL DATA:  38 year old male with persistent cough. Congestion, fever. EXAM: CHEST - 2 VIEW COMPARISON:  Chest radiographs 10/17/2018. FINDINGS: PA and lateral views 1051 hours. Lung volumes compatible with good inspiratory effort. Normal cardiac size and mediastinal contours. Visualized tracheal air column is within normal limits. Lung markings appear stable since 2020, within normal limits. Both lungs appear clear. No pneumothorax or pleural effusion. Negative visible bowel gas and osseous structures. IMPRESSION: Negative.  No cardiopulmonary abnormality. Electronically Signed   By: Odessa Fleming M.D.   On: 04/22/2023 11:19    Procedures Procedures (including critical care time)  Medications Ordered in UC Medications - No data to display  Initial Impression / Assessment and Plan / UC Course  I have reviewed the triage vital signs and the nursing notes.  Pertinent labs & imaging results that were available during my care of the patient were reviewed by me and considered in my medical decision making (see chart for details).     Due to cough for over a month and wheezes and coarse breath sounds on exam, performed chest X-ray. Reviewed negative chest X-ray results with patient. Discussed that he probably has a sinus infection and bronchitis. Will treat for probable bacterial etiology due to length of  symptoms (over a month) and recent worsening of symptoms. Discussed that I feel it is not necessary to test for Influenza at this time since it will not alter treatment. Recommend start Augmentin 875mg  twice a day for 7 days- take with food. May take Tessalon cough pills 200mg  every 8 hours as needed for cough. May also use OTC Delsym 2 teaspoons every 12 hours as needed for cough. Rest. Continue to push fluids to help loosen up mucus in sinuses and chest. Follow-up in 4 to 5 days if not improving   Final Clinical Impressions(s) / UC Diagnoses   Final diagnoses:  Acute cough  Acute bronchitis, unspecified organism  Acute non-recurrent pansinusitis     Discharge Instructions      Recommend start Augmentin 875mg  twice a day for 7 days with food. May take Tessalon cough pills 1 every 8 hours as needed. Continue to push fluids to help loosen up mucus in sinuses and chest. May also take OTC Delsym 10ml every 12 hours as needed for cough. Follow-up in 4 to 5 days if not improving.      ED Prescriptions     Medication Sig Dispense Auth. Provider   amoxicillin-clavulanate (AUGMENTIN) 875-125 MG tablet Take 1 tablet by mouth every 12 (twelve) hours for 7 days. 14 tablet Eddis Pingleton, Ali Lowe, NP   benzonatate (TESSALON) 200 MG capsule Take 1 capsule (200 mg total) by mouth every 8 (eight) hours as needed for cough. 21 capsule Lashanta Elbe, Ali Lowe, NP      PDMP not reviewed this encounter.   Sudie Grumbling, NP 04/23/23 1017
